# Patient Record
Sex: Female | Born: 1945 | Race: White | Hispanic: No | Smoking: Never smoker
Health system: Southern US, Community
[De-identification: ages and names within clinical notes are randomized; demographics above are authoritative.]

---

## 2021-05-04 ENCOUNTER — Emergency Department (HOSPITAL_COMMUNITY)
Admission: EM | Admit: 2021-05-04 | Discharge: 2021-05-04 | Discharge status: Against Medical Advice | Payer: Medicare PPO | Attending: Emergency Medicine | Admitting: Emergency Medicine

## 2021-05-04 ENCOUNTER — Encounter (HOSPITAL_COMMUNITY): Payer: Self-pay | Admitting: *Deleted

## 2021-05-04 ENCOUNTER — Emergency Department (HOSPITAL_COMMUNITY): Payer: Medicare PPO

## 2021-05-04 DIAGNOSIS — Z5321 Procedure and treatment not carried out due to patient leaving prior to being seen by health care provider: Secondary | ICD-10-CM | POA: Diagnosis not present

## 2021-05-04 DIAGNOSIS — M549 Dorsalgia, unspecified: Secondary | ICD-10-CM | POA: Diagnosis present

## 2021-05-04 DIAGNOSIS — W19XXXA Unspecified fall, initial encounter: Secondary | ICD-10-CM | POA: Insufficient documentation

## 2021-05-04 NOTE — ED Triage Notes (Signed)
Larey Seat 04-22-21. Pain in back

## 2022-11-07 IMAGING — DX DG HIP (WITH OR WITHOUT PELVIS) 2-3V*R*
3 series · 3 of 3 positions shown · non-contrast
Comparison: None.

CLINICAL DATA: Continued right hip pain after fall almost two weeks
ago.

EXAM:
DG HIP (WITH OR WITHOUT PELVIS) 2-3V RIGHT

[pelvis ap]
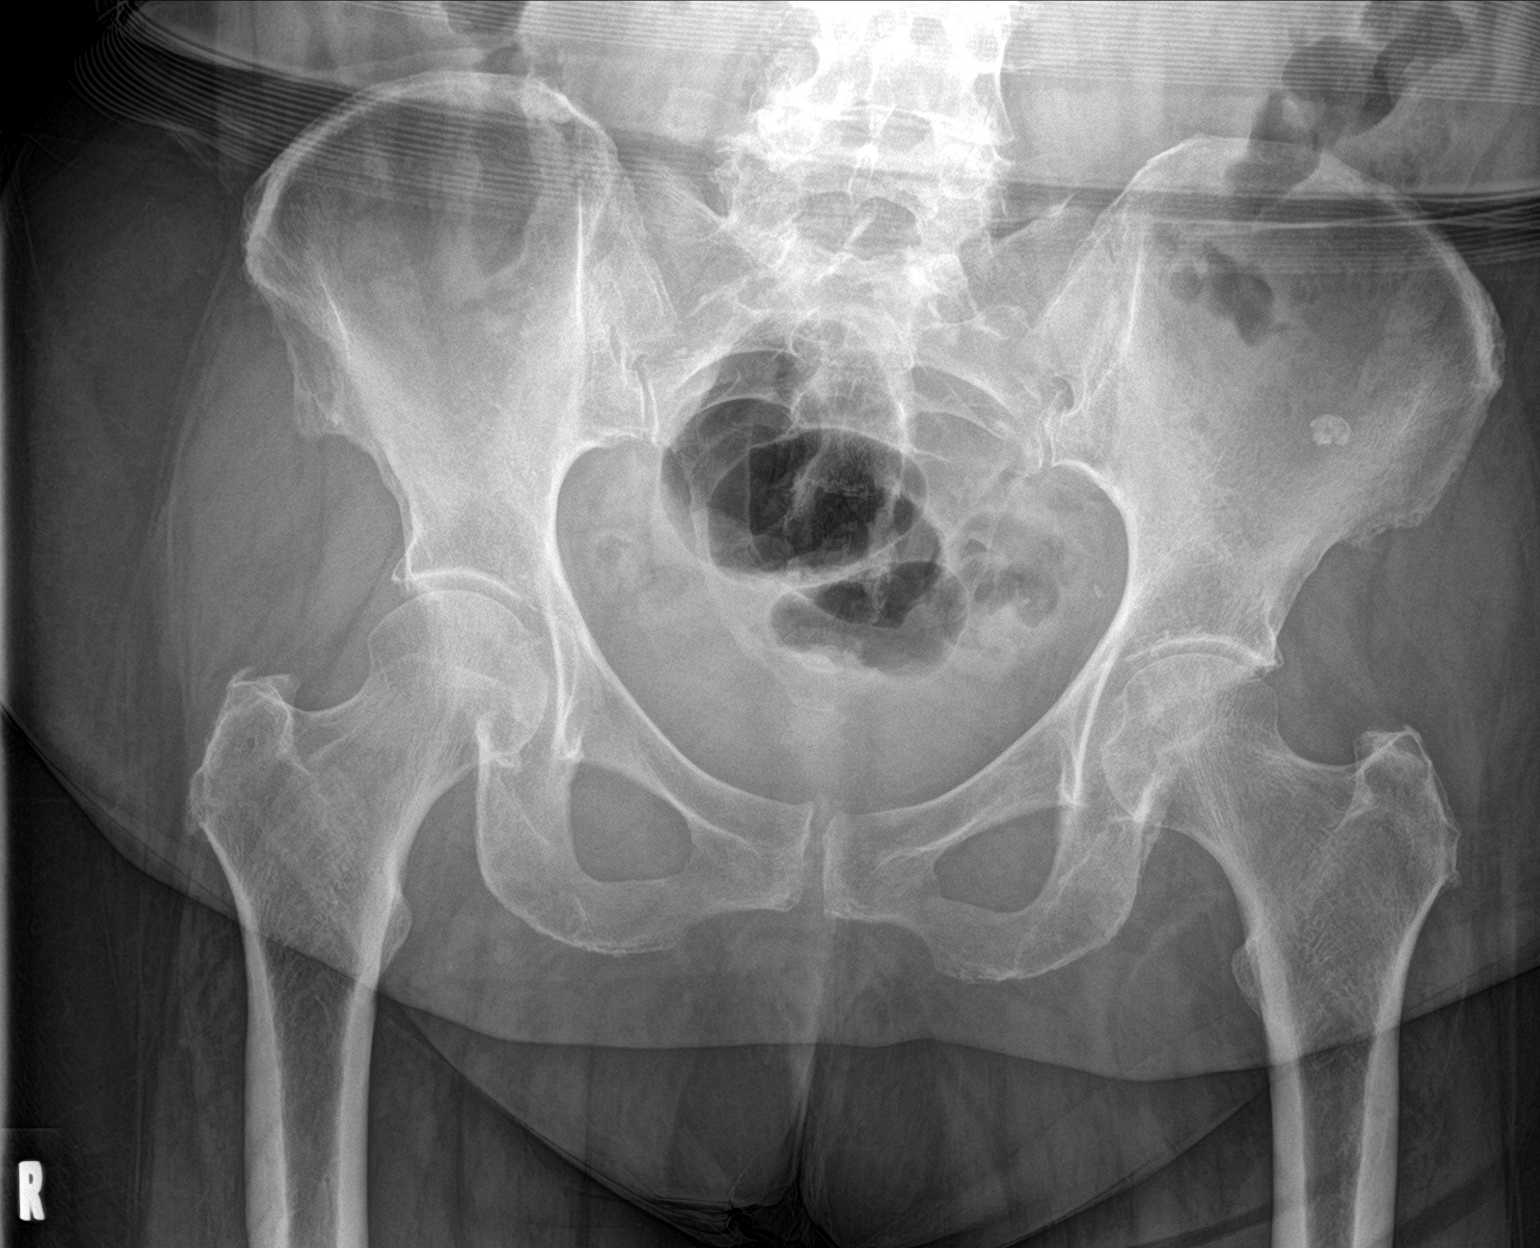

[hip ap]
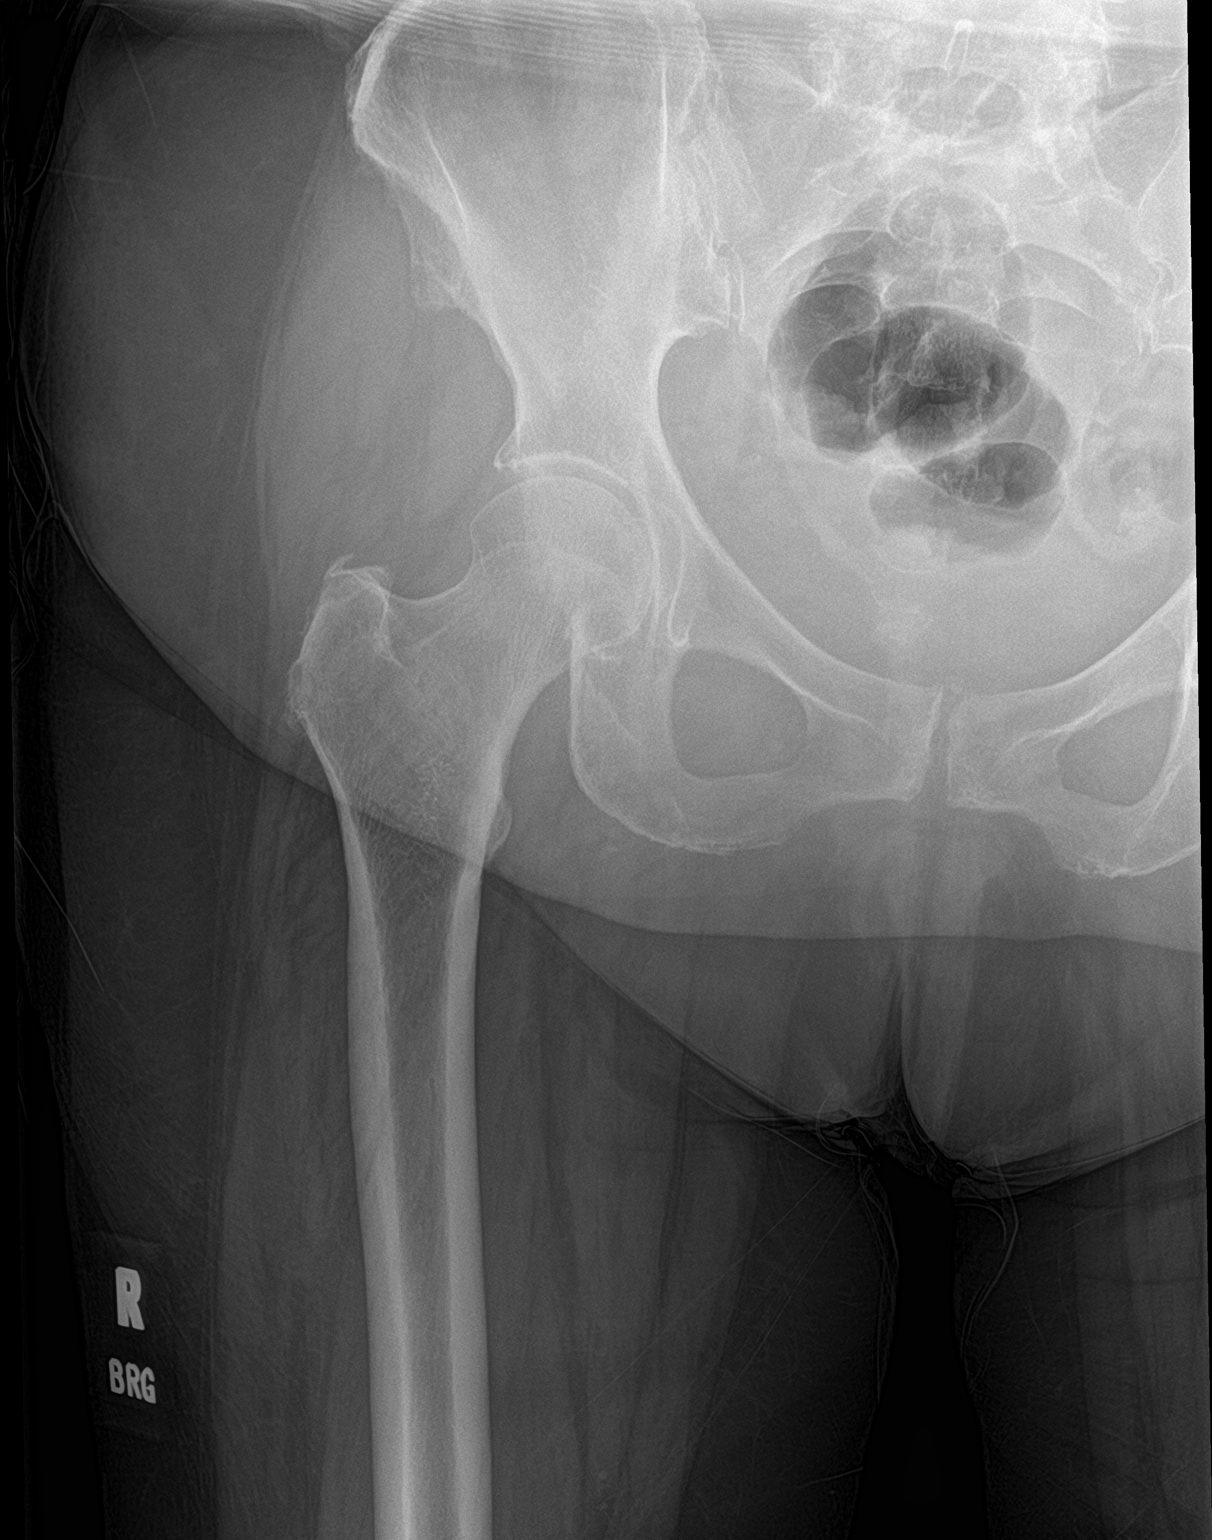

[hip lat]
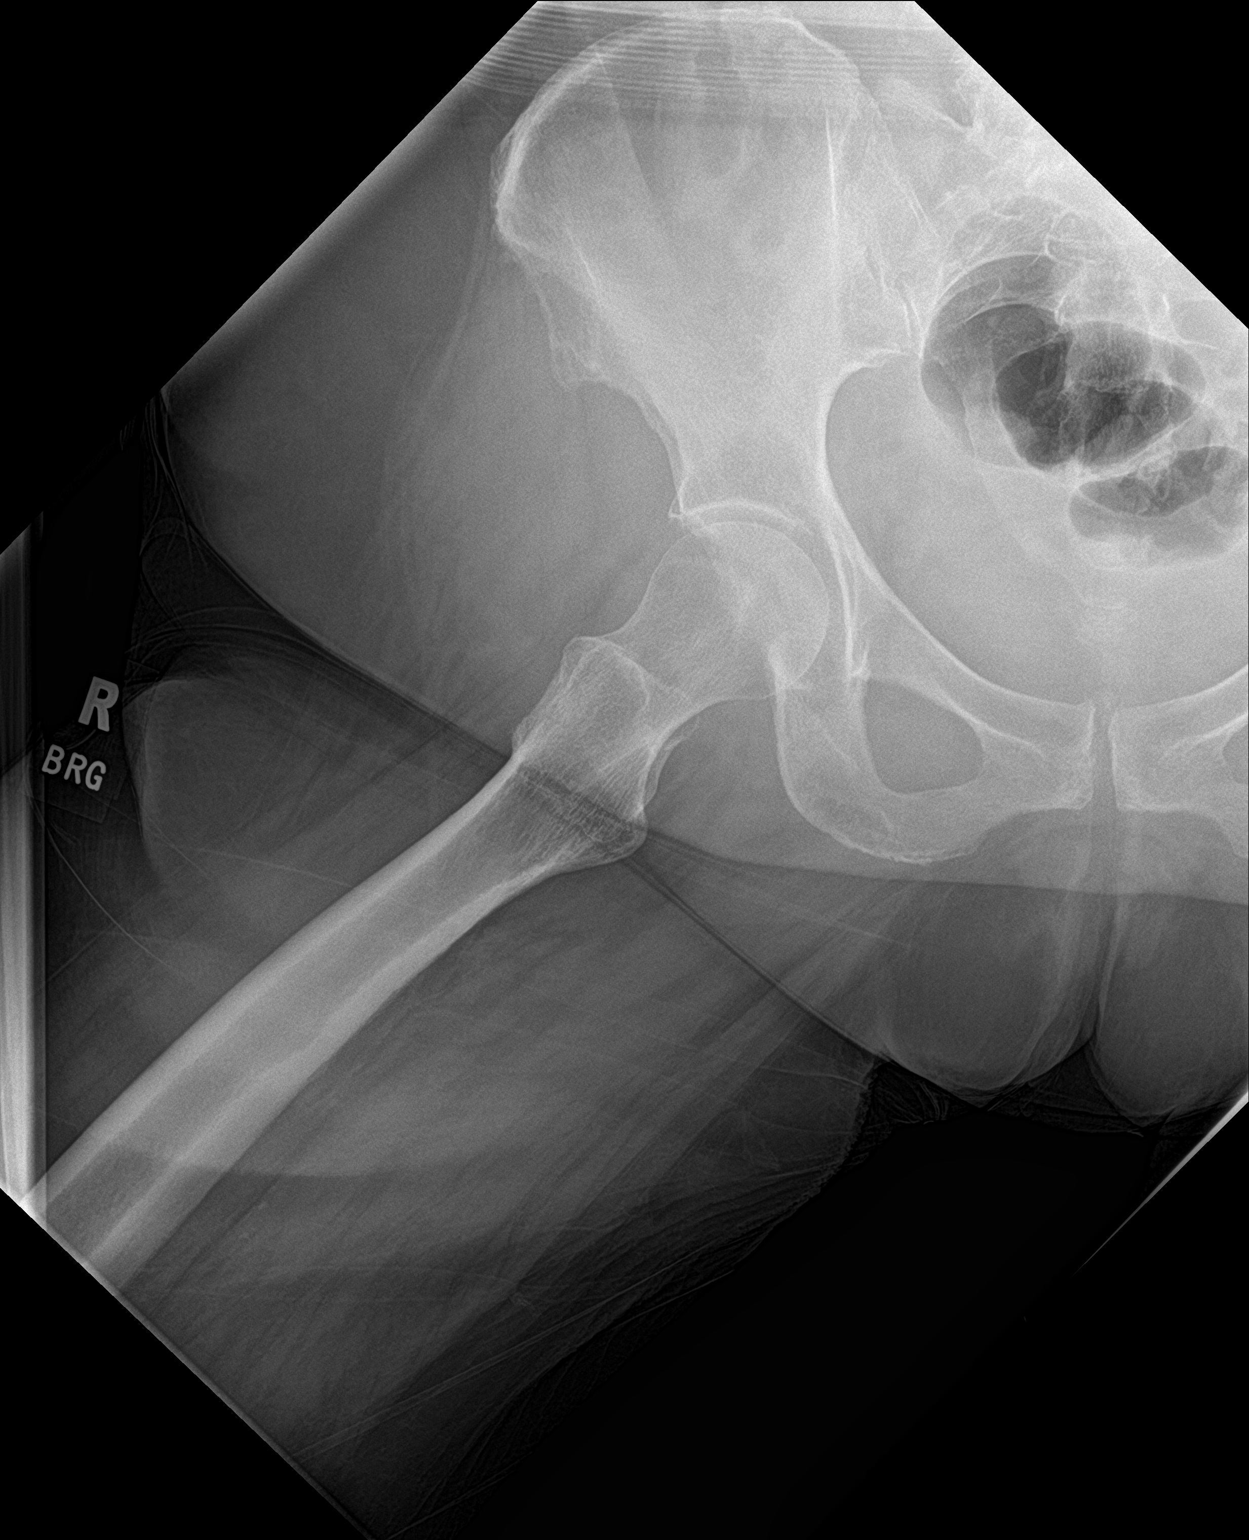

[3 of 3 positions shown; findings below may reference images not displayed]

FINDINGS: No acute fracture or dislocation. Mild left hip joint space
narrowing. The right hip joint space is preserved. Bone
mineralization is normal. Small round calcification overlying the
left iliac bone is likely a soft tissue granuloma.
IMPRESSION: 1.  No acute osseous abnormality.
2. Mild left hip osteoarthritis.
